# Patient Record
Sex: Female | Born: 1970 | Race: White | Hispanic: No | Marital: Married
Health system: Midwestern US, Community
[De-identification: ages and names within clinical notes are randomized; demographics above are authoritative.]

## PROBLEM LIST (undated history)

## (undated) DIAGNOSIS — Q211 Atrial septal defect, unspecified: Secondary | ICD-10-CM

## (undated) DIAGNOSIS — Z1231 Encounter for screening mammogram for malignant neoplasm of breast: Secondary | ICD-10-CM

## (undated) DIAGNOSIS — Z1159 Encounter for screening for other viral diseases: Secondary | ICD-10-CM

## (undated) DIAGNOSIS — M5416 Radiculopathy, lumbar region: Secondary | ICD-10-CM

---

## 2016-01-07 LAB — LIPID PANEL
Cholesterol, total: 205 MG/DL — ABNORMAL HIGH (ref 0–200)
HDL Cholesterol: 72 MG/DL (ref >60)
HDL Cholesterol: HIGH
LDL, calculated: 115 MG/DL — ABNORMAL HIGH (ref <100)
LDL, calculated: BORDERLINE
LDL, calculated: HIGH
Triglyceride: 92 MG/DL (ref 0–150)

## 2016-01-07 LAB — FAX TO

## 2016-01-07 LAB — GLUCOSE, FASTING: FASTING GLUCOSE: 90 MG/DL (ref 70–100)

## 2016-01-28 NOTE — Progress Notes (Signed)
Visit Type:  Annual gyn exam  Primary Provider:  Dr Erlinda Hong [facility]    CC:  Annual gyn exam.    History of Present Illness:  Patient presents today for her annual gyn exam, states no concerns today .  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 She is having intense night time hot flashes and would like a second trial of rx HRT.     She had a LEEP when she was younger, and two recent paps were normal with positive HPV. Last year a colposcopy showed LSIL. Her paps are being repeated annually with HPV testing.    Her husband had colon cancer and is well at this time after completing treatment. She is having pain with intercourse which has not been effectively treated with prior trials of hrt in the various formulations given.      Patient Health Questionnaire    PHQ-2   1. Over the last 2 weeks how often have you been bothered by any of the following problems?     a. Little interest or pleasure in doing things?Marland KitchenMarland KitchenMarland KitchenNot at all     b. Feeling down, depressed, irritable, or hopeless?Marland KitchenMarland KitchenMarland KitchenNot at all    Total PHQ-2 Score: 0      Problems:    Added new problem of ENCOUNTER FOR GYNECOLOGICAL EXAMINATION (GENERAL) (ROUTINE) WITHOUT ABNORMAL FINDINGS (ICD-V72.31) (ICD10-Z01.419) - Signed  Added new problem of ENCOUNTER FOR SCREENING FOR MALIGNANT NEOPLASM OF CERVIX (ICD-V76.2) (ICD10-Z12.4) - Signed  Removed problem of *ROUTINE GYNECOLOGICAL EXAMINATION (ICD-V72.31) - Signed   Assessed ENCOUNTER FOR SCREENING FOR MALIGNANT NEOPLASM OF CERVIX as comment only - Signed  Assessed ENCOUNTER FOR GYNECOLOGICAL EXAMINATION (GENERAL) (ROUTINE) WITHOUT ABNORMAL FINDINGS as comment only - Signed      Past Medical History:     migraines-no aura     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 grandmother in 77's      Risk Factors:     Smoked Tobacco Use:  Never smoker  Smokeless Tobacco Use:  Never     Counseled to quit/cut down:  no  Passive smoke exposure:  no  Drug use:  no  HIV high-risk behavior:  no  Caffeine  use:  1 drinks per day  Alcohol use:  yes     Type:  wine     Drinks per day:  social  Exercise:  yes     Times per week:  3-5     Type of Exercise:  running,yoga  Seatbelt use:  100 %  Sun Exposure:  occasionally w sunscreen    Family History Risk Factors:     Family History of MI in females < 63 years old:  no     Family History of MI in males < 58 years old:  no      Review of Systems     Gen: no fever, wt change  HEENT: no vis changes, nosebleeding, gumbleeding  CV: occas palpitations, no chest pain  RESP: no wheezing, SOB, cough  GI: no heartburn, nausea, vomiting, loose stool, constipation, blood in stool  GU: no hematuria, dysuria, freq, hesitancy, incontinence  GYN: some pain but no bleeding with IC; sex active without concerns  BREAST: no masses, skin changes, pain, discharge  MUSC/SKEL: no back or joint pain   SKIN: no new lesions  NEURO: no HA, dizziness  PSYCH: no depression,  has anxiety, stress      Vital Signs:    Patient Profile:    45 Years Old Female  Height:     67 inches  Weight:     127.4 pounds  BMI:        19.95     BSA:        1.67  BP Sitting: 100 / 68  (right arm)    Cuff size:  regular    Patient in pain?    No    Problems: Active problems were reviewed with the patient during this visit.  Medications: Medications were reviewed with the patient during this visit.  Allergies: Allergies were reviewed with the patient during this visit.        Vitals Entered By: Derrill Kay CMA (January 28, 2016 3:11 PM)      Physical Exam    General:      well developed, well nourished, in no acute distress  Head:      normocephalic and atraumatic  Mouth:      no deformity or lesions with good dentition  Neck:      no masses, thyromegaly, or abnormal cervical nodes  Chest Wall:      no deformities or breast masses noted  Breasts:      no dominant masses, nipple discharge, skin changes, or axillary adenopathy    Lungs:      clear bilaterally to A & P  Heart:      regular rate and rhythm, S1, S2 without murmurs, rubs, gallops, or clicks  Abdomen:      bowel sounds positive; abdomen soft and non-tender without masses, organomegaly, or hernias noted  Msk:      no deformity or scoliosis noted with normal posture and gait  Extremities:      no clubbing, cyanosis, edema, or deformity noted with normal full range of motion of all joints  Skin:      intact without lesions or rashes  Cervical Nodes:      no significant adenopathy  Axillary Nodes:      no significant adenopathy  Inguinal Nodes:      no significant adenopathy  Psych:      alert and cooperative; normal mood and affect; normal attention span and concentration    Pelvic Exam:  Ext. genitalia:    normal appearance; no lesions or discharge       Urethra:       no discharge       Bladder:       no cystocele        Vagina:        normal appearing without lesions or discharge       Cervix:        normal appearance; no lesions or discharge       Uterus:        normal size and position; no masses       Uterine size:      normal       Adnexa:        no masses or tenderness       Other:     pap done      Blood Pressure:  Today's BP: 100/68 mm Hg            Impression & Recommendations:    Problem # 1:  ENCOUNTER FOR GYNECOLOGICAL EXAMINATION (GENERAL) (ROUTINE) WITHOUT ABNORMAL FINDINGS (ICD-V72.31) (ICD10-Z01.419)  Assessment: Comment Only  normal exam; pap done today. negative colposcopy last year; if this year's pap is HPV pos, would repeat ecc. discontinued progesterone cream as it was ineffective.  Orders:  Preventive - Est ([phone])      Problem # 2:  ENCOUNTER FOR SCREENING FOR MALIGNANT NEOPLASM OF CERVIX (ICD-V76.2) (VQQ59-D63.4)  Assessment: Comment Only  If HPV is still positive, I would recommend repeating ecc.  Orders:  Pap Smear  Screening (PRINT) ([id number])  Pap Smear Collection ([id number])  HPV Screening (PRINT) ([id number])      Problem # 3:  HORMONE REPLACEMENT THERAPY (POSTMENOPAUSAL) (ICD-V07.4) (ICD10-Z79.890)  discussed trial of continuous combined HRT for relief of poor sleep and hot flashes. safety and duration of use discussed.    Medications Added to Medication List This Visit:  1)  Pristiq 25 Mg Xr24h-tab (Desvenlafaxine succinate) .... Take one by mouth once a day.  2)  Estradiol-norethindrone Acet 1-0.5 Mg Tabs (Estradiol-norethindrone acet) .... Take one by mouth once a day.      Prescriptions:  ESTRADIOL-NORETHINDRONE ACET 1-0.5 MG TABS (ESTRADIOL-NORETHINDRONE ACET) Take one by mouth once a day.  #28[Tablet] x 11      Entered and Authorized by:  Yehuda Savannah MD      Signed by:  Yehuda Savannah MD on 01/28/2016      Method used:    Electronically to               [facility] (retail)              [place], [place]  [id number]               Ph: [phone]              Fax: [phone]      RxID:   [id number]            Additional Clinical Support Documentation The medication list contains greater than 5 medications and was reviewed with the patient today. Derrill Kay CMA  January 28, 2016 3:16 PM          I have either personally documented or reviewed the patients past medical, surgical, family and social history and review of systems if present in the note and when recorded by clinical  staff.             Electronically Signed by Yehuda Savannah MD on 01/28/2016 at 3:59 PM  ________________________________________________________________________

## 2016-02-02 LAB — PAP SMEAR (TO SEACOAST)
PAP SMEAR INTERPRETATION, PSM1: NEGATIVE
PAP smear interpretation: NEGATIVE

## 2016-02-02 LAB — HPV (TO SEACOAST)
HPV INTERPRETATION, HP2: HIGH
HPV INTERPRETATION, HP2: UNDETECTABLE
HPV interpretation: HIGH
HPV interpretation: UNDETECTABLE

## 2016-05-03 LAB — RHEUMATOID FACTOR, QT: RHEUMATOID FACTOR: <15 IU/ML (ref <15)

## 2016-05-03 LAB — SED RATE (ESR): ESR: 3 MM/HR (ref 0–20)

## 2016-05-04 LAB — HLA-B27
HLA-B27 BLOOD, THL2: POSITIVE
HLA-B27 BLOOD: POSITIVE
INTERPRETATION, THLI: 200
INTERPRETATION, THLI: DETECTED
INTERPRETATION, THLI: NORMAL
INTERPRETATION: 200
INTERPRETATION: DETECTED
INTERPRETATION: NORMAL

## 2016-12-27 ENCOUNTER — Encounter

## 2016-12-27 ENCOUNTER — Inpatient Hospital Stay: Admit: 2016-12-27 | Payer: PRIVATE HEALTH INSURANCE

## 2016-12-27 DIAGNOSIS — M5416 Radiculopathy, lumbar region: Secondary | ICD-10-CM

## 2017-10-12 ENCOUNTER — Encounter

## 2017-10-12 ENCOUNTER — Inpatient Hospital Stay: Admit: 2017-10-12 | Payer: PRIVATE HEALTH INSURANCE

## 2017-10-12 DIAGNOSIS — Z1231 Encounter for screening mammogram for malignant neoplasm of breast: Secondary | ICD-10-CM

## 2018-06-29 ENCOUNTER — Encounter

## 2018-10-18 ENCOUNTER — Encounter

## 2018-10-18 ENCOUNTER — Inpatient Hospital Stay: Admit: 2018-10-18 | Payer: PRIVATE HEALTH INSURANCE

## 2018-10-18 DIAGNOSIS — Z1231 Encounter for screening mammogram for malignant neoplasm of breast: Secondary | ICD-10-CM

## 2018-11-29 ENCOUNTER — Inpatient Hospital Stay
Admit: 2018-11-29 | Discharge: 2018-11-29 | Disposition: A | Discharge status: Home / Self Care | Payer: PRIVATE HEALTH INSURANCE | Attending: Emergency Medicine

## 2018-11-29 DIAGNOSIS — S39012A Strain of muscle, fascia and tendon of lower back, initial encounter: Secondary | ICD-10-CM

## 2018-11-29 MED ORDER — METHYLPREDNISOLONE 4 MG TABS IN A DOSE PACK
4 mg | ORAL | 0 refills | Status: AC
Start: 2018-11-29 — End: ?

## 2018-11-29 MED ORDER — CARISOPRODOL 350 MG TAB
350 mg | ORAL_TABLET | Freq: Three times a day (TID) | ORAL | 0 refills | Status: AC | PRN
Start: 2018-11-29 — End: ?

## 2018-11-29 NOTE — Other (Signed)
The history is provided by the patient.   Back Pain    This is a new problem. Pertinent negatives include no fever, no numbness, no abdominal pain, no dysuria, no pelvic pain and no weakness.   The patient is a 48 year old female who presents to [facility] for evaluation of central to right low back pain for last 5 days.  She does not recall any inciting factor, although it did start after doing a yoga session.  The pain is worse with movement.  It is a constant pain and rated as 6/10 intensity.  It does not radiate down her legs.  No paresthesias to the legs or perineum.  No bowel or bladder dysfunction.  No fever or weight loss.  No abdominal pain.  She has a history of similar problems in the past.  Most recently, last fall she had a steroid injection that was not helpful.  She has had prior MRI that showed degenerative changes, but no definitive reason for her back pains.  A year and the half ago she had a steroid injection that was beneficial.  She does not recall ever having been on oral steroids for this.  She has tried ibuprofen, heat, gentle stretching without relief.    Past Medical History:   Diagnosis Date   ??? Menopause     39        Past Surgical History:   Procedure Laterality Date   ??? HX ROTATOR CUFF REPAIR Right    ??? HX WISDOM TEETH EXTRACTION           Family History   Problem Relation Age of Onset   ??? Breast Cancer Maternal Grandmother         3670's        Social History     Socioeconomic History   ??? Marital status: MARRIED     Spouse name: Not on file   ??? Number of children: Not on file   ??? Years of education: Not on file   ??? Highest education level: Not on file   Occupational History   ??? Not on file   Social Needs   ??? Financial resource strain: Not on file   ??? Food insecurity     Worry: Not on file     Inability: Not on file   ??? Transportation needs     Medical: Not on file     Non-medical: Not on file   Tobacco Use   ??? Smoking status: Never Smoker   ??? Smokeless tobacco: Never Used    Substance and Sexual Activity   ??? Alcohol use: Yes     Alcohol/week: 3.0 standard drinks     Types: 3 Glasses of wine per week   ??? Drug use: Never   ??? Sexual activity: Not on file   Lifestyle   ??? Physical activity     Days per week: Not on file     Minutes per session: Not on file   ??? Stress: Not on file   Relationships   ??? Social Wellsite geologistconnections     Talks on phone: Not on file     Gets together: Not on file     Attends religious service: Not on file     Active member of club or organization: Not on file     Attends meetings of clubs or organizations: Not on file     Relationship status: Not on file   ??? Intimate partner violence  Fear of current or ex partner: Not on file     Emotionally abused: Not on file     Physically abused: Not on file     Forced sexual activity: Not on file   Other Topics Concern   ??? Not on file   Social History Narrative   ??? Not on file                ALLERGIES: Bactrim [sulfamethoprim] and Pcn [penicillins]    Review of Systems   Constitutional: Negative for fever and unexpected weight change.        No IV drug use.   Gastrointestinal: Negative for abdominal pain.        No bowel incontinence.   Genitourinary: Negative for dysuria, frequency, hematuria and pelvic pain.        No urinary incontinence or retention.   Musculoskeletal: Positive for back pain.   Allergic/Immunologic:        No history of cancer.   Neurological: Negative for weakness and numbness.       Vitals:    11/29/18 1353   BP: 109/73   Pulse: 87   Resp: 16   Temp: 97.9 ??F (36.6 ??C)   SpO2: 98%       Physical Exam  Vitals signs and nursing note reviewed.   Constitutional:       General: She is not in acute distress.     Appearance: She is not ill-appearing or diaphoretic.   HENT:      Head: Atraumatic.   Cardiovascular:      Rate and Rhythm: Normal rate and regular rhythm.      Heart sounds: No murmur.   Pulmonary:      Effort: Pulmonary effort is normal.      Breath sounds: Normal breath sounds.   Abdominal:       General: Abdomen is flat. Bowel sounds are normal.      Palpations: Abdomen is soft. There is no mass.      Tenderness: There is no abdominal tenderness. There is no guarding.   Musculoskeletal:      Comments: Spine is nontender to palpation and percussion, with the exception of the sacral area.  No spasms noted.  No obvious rash.  The tenderness is more on the right than the left.   Skin:     General: Skin is warm and dry.      Coloration: Skin is not pale.   Neurological:      Mental Status: She is alert and oriented to person, place, and time.      Comments: Speech is clear.  Motor function is intact in the lower extremities.  Normal light touch sensation in lower extremities.  Deep tendon reflexes are 2+ and symmetric in the patellar tendons and 1+ and symmetric in the ankle tendons.  No tripod sign but doing passive straight leg raise while sitting does increase her back pain.   Psychiatric:         Mood and Affect: Mood normal.         MDM  Number of Diagnoses or Management Options  Acute myofascial strain of lumbosacral region, initial encounter:   Diagnosis management comments: The patient has signs and symptoms consistent with lumbosacral strain.  No clear indication that she has a herniated disc, diskitis, paraspinous abscess, tumor, dissection, or other serious process causing this pain.  I find no indication for diagnostic studies at this time.  We discussed treatment options, which included  use of a steroid and muscle relaxant.  She would like to try this treatment.  I did review with her potential side effects of the medications.  No obvious significant interactions expected from these medications under current medicines.  She will begin the Medrol Dosepak tomorrow morning.  She can use ibuprofen interim but understands to not use it once she starts Medrol Dosepak.  She can use the carisoprodol as needed for muscle relaxation.  Precautions regarding sedation given.  She was given contact  information for Dr. Francee Gentile to follow up with if not improved in 5 days.  We also reviewed precautions/signs/symptoms regarding when she would need to go the hospital emergency department right away.    I have discussed the working diagnosis, treatment, precautions and follow up with the patient/guardian.  It was explained to them that, in some circumstances, symptoms can change over time, resulting in the need for further evaluation and an ultimate different diagnosis from today's assessment.  The patient/guardian was made aware of signs and symptoms to look for, in the event of worsening condition, that would necessitate going to the hospital emergency department for further evaluation and management.   They were given the opportunity to ask questions.  Those questions were answered to the best of my ability with the information available at this time.  She indicated acknowledgement of this discussion.    Patient Progress  Patient progress: stable             Procedures      Discharge Medication List as of 11/29/2018  2:21 PM      START taking these medications    Details   methylPREDNISolone (Medrol, Pak,) 4 mg tablet Take as directed on package, Normal, Disp-1 Dose Pack, R-0      carisoprodoL (SOMA) 350 mg tablet Take 1 Tab by mouth every eight (8) hours as needed for Muscle Spasm(s). Max Daily Amount: 1,050 mg., Print, Disp-12 Tab, R-0         CONTINUE these medications which have NOT CHANGED    Details   estradioL (ESTRACE) 1 mg tablet Take 1 mg by mouth daily., Historical Med      desvenlafaxine succinate (Pristiq) 25 mg ER tablet Take 25 mg by mouth., Historical Med                 ICD-10-CM ICD-9-CM   1. Acute myofascial strain of lumbosacral region, initial encounter S39.012A 846.0

## 2018-11-29 NOTE — ED Provider Notes (Signed)
The history is provided by the patient.   Back Pain    This is a new problem. Pertinent negatives include no fever, no numbness, no abdominal pain, no dysuria, no pelvic pain and no weakness.   The patient is a 48 year old female who presents to [facility] for evaluation of central to right low back pain for last 5 days.  She does not recall any inciting factor, although it did start after doing a yoga session.  The pain is worse with movement.  It is a constant pain and rated as 6/10 intensity.  It does not radiate down her legs.  No paresthesias to the legs or perineum.  No bowel or bladder dysfunction.  No fever or weight loss.  No abdominal pain.  She has a history of similar problems in the past.  Most recently, last fall she had a steroid injection that was not helpful.  She has had prior MRI that showed degenerative changes, but no definitive reason for her back pains.  A year and the half ago she had a steroid injection that was beneficial.  She does not recall ever having been on oral steroids for this.  She has tried ibuprofen, heat, gentle stretching without relief.    Past Medical History:   Diagnosis Date   ??? Menopause     39        Past Surgical History:   Procedure Laterality Date   ??? HX ROTATOR CUFF REPAIR Right    ??? HX WISDOM TEETH EXTRACTION           Family History   Problem Relation Age of Onset   ??? Breast Cancer Maternal Grandmother         2670's        Social History     Socioeconomic History   ??? Marital status: MARRIED     Spouse name: Not on file   ??? Number of children: Not on file   ??? Years of education: Not on file   ??? Highest education level: Not on file   Occupational History   ??? Not on file   Social Needs   ??? Financial resource strain: Not on file   ??? Food insecurity     Worry: Not on file     Inability: Not on file   ??? Transportation needs     Medical: Not on file     Non-medical: Not on file   Tobacco Use   ??? Smoking status: Never Smoker   ??? Smokeless tobacco: Never Used    Substance and Sexual Activity   ??? Alcohol use: Yes     Alcohol/week: 3.0 standard drinks     Types: 3 Glasses of wine per week   ??? Drug use: Never   ??? Sexual activity: Not on file   Lifestyle   ??? Physical activity     Days per week: Not on file     Minutes per session: Not on file   ??? Stress: Not on file   Relationships   ??? Social Wellsite geologistconnections     Talks on phone: Not on file     Gets together: Not on file     Attends religious service: Not on file     Active member of club or organization: Not on file     Attends meetings of clubs or organizations: Not on file     Relationship status: Not on file   ??? Intimate partner violence  Fear of current or ex partner: Not on file     Emotionally abused: Not on file     Physically abused: Not on file     Forced sexual activity: Not on file   Other Topics Concern   ??? Not on file   Social History Narrative   ??? Not on file                ALLERGIES: Bactrim [sulfamethoprim] and Pcn [penicillins]    Review of Systems   Constitutional: Negative for fever and unexpected weight change.        No IV drug use.   Gastrointestinal: Negative for abdominal pain.        No bowel incontinence.   Genitourinary: Negative for dysuria, frequency, hematuria and pelvic pain.        No urinary incontinence or retention.   Musculoskeletal: Positive for back pain.   Allergic/Immunologic:        No history of cancer.   Neurological: Negative for weakness and numbness.       Vitals:    11/29/18 1353   BP: 109/73   Pulse: 87   Resp: 16   Temp: 97.9 ??F (36.6 ??C)   SpO2: 98%       Physical Exam  Vitals signs and nursing note reviewed.   Constitutional:       General: She is not in acute distress.     Appearance: She is not ill-appearing or diaphoretic.   HENT:      Head: Atraumatic.   Cardiovascular:      Rate and Rhythm: Normal rate and regular rhythm.      Heart sounds: No murmur.   Pulmonary:      Effort: Pulmonary effort is normal.      Breath sounds: Normal breath sounds.   Abdominal:      General:  Abdomen is flat. Bowel sounds are normal.      Palpations: Abdomen is soft. There is no mass.      Tenderness: There is no abdominal tenderness. There is no guarding.   Musculoskeletal:      Comments: Spine is nontender to palpation and percussion, with the exception of the sacral area.  No spasms noted.  No obvious rash.  The tenderness is more on the right than the left.   Skin:     General: Skin is warm and dry.      Coloration: Skin is not pale.   Neurological:      Mental Status: She is alert and oriented to person, place, and time.      Comments: Speech is clear.  Motor function is intact in the lower extremities.  Normal light touch sensation in lower extremities.  Deep tendon reflexes are 2+ and symmetric in the patellar tendons and 1+ and symmetric in the ankle tendons.  No tripod sign but doing passive straight leg raise while sitting does increase her back pain.   Psychiatric:         Mood and Affect: Mood normal.         MDM  Number of Diagnoses or Management Options  Acute myofascial strain of lumbosacral region, initial encounter:   Diagnosis management comments: The patient has signs and symptoms consistent with lumbosacral strain.  No clear indication that she has a herniated disc, diskitis, paraspinous abscess, tumor, dissection, or other serious process causing this pain.  I find no indication for diagnostic studies at this time.  We discussed treatment options, which included  use of a steroid and muscle relaxant.  She would like to try this treatment.  I did review with her potential side effects of the medications.  No obvious significant interactions expected from these medications under current medicines.  She will begin the Medrol Dosepak tomorrow morning.  She can use ibuprofen interim but understands to not use it once she starts Medrol Dosepak.  She can use the carisoprodol as needed for muscle relaxation.  Precautions regarding sedation given.  She was given contact information for Dr.  Francee Gentile to follow up with if not improved in 5 days.  We also reviewed precautions/signs/symptoms regarding when she would need to go the hospital emergency department right away.    I have discussed the working diagnosis, treatment, precautions and follow up with the patient/guardian.  It was explained to them that, in some circumstances, symptoms can change over time, resulting in the need for further evaluation and an ultimate different diagnosis from today's assessment.  The patient/guardian was made aware of signs and symptoms to look for, in the event of worsening condition, that would necessitate going to the hospital emergency department for further evaluation and management.   They were given the opportunity to ask questions.  Those questions were answered to the best of my ability with the information available at this time.  She indicated acknowledgement of this discussion.    Patient Progress  Patient progress: stable             Procedures      Discharge Medication List as of 11/29/2018  2:21 PM      START taking these medications    Details   methylPREDNISolone (Medrol, Pak,) 4 mg tablet Take as directed on package, Normal, Disp-1 Dose Pack, R-0      carisoprodoL (SOMA) 350 mg tablet Take 1 Tab by mouth every eight (8) hours as needed for Muscle Spasm(s). Max Daily Amount: 1,050 mg., Print, Disp-12 Tab, R-0         CONTINUE these medications which have NOT CHANGED    Details   estradioL (ESTRACE) 1 mg tablet Take 1 mg by mouth daily., Historical Med      desvenlafaxine succinate (Pristiq) 25 mg ER tablet Take 25 mg by mouth., Historical Med                 ICD-10-CM ICD-9-CM   1. Acute myofascial strain of lumbosacral region, initial encounter S39.012A 846.0

## 2018-12-06 ENCOUNTER — Inpatient Hospital Stay
Admit: 2018-12-06 | Discharge: 2018-12-06 | Disposition: A | Discharge status: Home / Self Care | Payer: PRIVATE HEALTH INSURANCE | Attending: Medical

## 2018-12-06 DIAGNOSIS — R51 Headache: Secondary | ICD-10-CM

## 2018-12-06 LAB — CBC WITH AUTOMATED DIFF
ABS. BASOPHILS: 0 10*3/uL (ref 0.0–0.1)
ABS. EOSINOPHILS: 0 10*3/uL (ref 0.0–0.5)
ABS. IMM. GRANS.: 0 10*3/uL (ref 0.00–0.03)
ABS. LYMPHOCYTES: 1.2 10*3/uL (ref 1.2–3.7)
ABS. MONOCYTES: 0.5 10*3/uL (ref 0.2–0.8)
ABS. NEUTROPHILS: 7 10*3/uL — ABNORMAL HIGH (ref 1.6–6.1)
BASOPHILS: 0 % (ref 0–1.2)
EOSINOPHILS: 0 %
HCT: 39 % (ref 36.0–45.0)
HGB: 13.4 g/dL (ref 11.8–14.8)
IMMATURE GRANULOCYTES: 1 % — ABNORMAL HIGH (ref 0.0–0.4)
LYMPHOCYTES: 14 %
MCH: 32.2 PG (ref 25.6–32.2)
MCHC: 34.4 g/dL (ref 32.2–35.5)
MCV: 93.8 FL (ref 80.0–95.0)
MONOCYTES: 6 %
MPV: 8.9 FL — ABNORMAL LOW (ref 9.4–12.4)
NEUTROPHILS: 79 %
PLATELET: 239 10*3/uL (ref 150–400)
RBC: 4.16 M/uL (ref 3.93–5.22)
RDW: 12.5 % (ref 11.6–14.4)
WBC: 8.8 10*3/uL (ref 4.0–10.0)

## 2018-12-06 LAB — METABOLIC PANEL, COMPREHENSIVE
A-G Ratio: 1 (ref 1.0–3.0)
ALT (SGPT): 16 U/L (ref 13–56)
AST (SGOT): 13 U/L — ABNORMAL LOW (ref 15–37)
Albumin: 3.8 g/dL (ref 3.4–5.0)
Alk. phosphatase: 40 U/L — ABNORMAL LOW (ref 45–117)
Anion gap: 4 mmol/L — ABNORMAL LOW (ref 5–15)
BUN/Creatinine ratio: 17 (ref 12–20)
BUN: 13 MG/DL (ref 7–18)
Bilirubin, total: 0.33 mg/dL (ref 0.20–1.20)
CO2: 29 mmol/L (ref 21–32)
Calcium: 8.6 MG/DL (ref 8.5–10.1)
Chloride: 105 mmol/L (ref 98–110)
Creatinine: 0.77 MG/DL (ref 0.55–1.02)
GFR est AA: >60 mL/min/{1.73_m2} (ref >60)
GFR est non-AA: >60 mL/min/{1.73_m2} (ref >60)
Glucose: 117 mg/dL — ABNORMAL HIGH (ref 70–100)
Potassium: 3.8 mmol/L (ref 3.5–5.1)
Protein, total: 7.6 g/dL (ref 6.4–8.2)
Sodium: 138 mmol/L (ref 136–145)

## 2018-12-06 LAB — TROPONIN I: Troponin-I, Qt.: <0.015 ng/mL (ref 0.000–0.040)

## 2018-12-06 LAB — EKG, 12 LEAD, INITIAL
Atrial Rate: 88 ms
Heart Rate: 87 {beats}/min
I-40 Front Axis: 17 deg
I-40 Horizontal Axis: 90 deg
P Duration: 117 ms
P Front Axis: 76 deg
P Horizontal Axis: 0 deg
P-R Interval: 143 ms
Q Onset: 506 ms
QRS Axis: 74 deg
QRS Horizontal Axis: 11 deg
QRSD Interval: 82 ms
QT Interval: 381 ms
QTcB: 459 ms
QTcF: 431 ms
RR Interval: 690 ms
S-T Front Axis: 48 deg
S-T Horizontal Axis: 63 deg
T Horizontal Axis: 36 deg
T Wave Axis: 51 deg
T-40 Front Axis: 83 deg
T-40 Horizontal Axis: -3 deg

## 2018-12-06 LAB — UA WITH REFLEX MICRO AND CULTURE
Bilirubin: NEGATIVE
Blood: NEGATIVE
Glucose: NEGATIVE mg/dL
Ketone: NEGATIVE mg/dL
Leukocyte Esterase: NEGATIVE
Nitrites: NEGATIVE
Protein: NEGATIVE mg/dL
Specific gravity: 1.017 (ref 1.001–1.035)
Urobilinogen: 0.2 EU/dL (ref 0.2–1.0)
pH (UA): >=8.5 (ref 5–8)

## 2018-12-06 LAB — EKG 12-LEAD
Atrial Rate: 88 ms
ECG QTC Interval: 381 ms
EKG I-40 FRONT AXIS: 17 deg
EKG I-40 HORIZONTAL AXIS: 90 deg
EKG P DURATION: 117 ms
EKG P FRONT AXIS: 76 deg
EKG P HORIZONTAL AXIS: 0 deg
EKG Q ONSET: 506 ms
EKG QRS AXIS: 74 deg
EKG QRS HORIZONTAL AXIS: 11 deg
EKG QRSD INTERVAL: 82 ms
EKG QTCB: 459 ms
EKG QTCF: 431 ms
EKG RR INTERVAL: 690 ms
EKG S-T FRONT AXIS: 48 deg
EKG S-T HORIZONTAL AXIS: 63 deg
EKG T HORIZONTAL AXIS: 36 deg
EKG T WAVE AXIS: 51 deg
EKG T-40 FRONT AXIS: 83 deg
EKG T-40 HORIZONTAL AXIS: -3 deg
Heart Rate: 87 {beats}/min
P-R Interval: 143 ms

## 2018-12-06 LAB — COMPREHENSIVE METABOLIC PANEL
ALT: 16 U/L (ref 13–56)
AST: 13 U/L — ABNORMAL LOW (ref 15–37)
Albumin/Globulin Ratio: 1 (ref 1.0–3.0)
Albumin: 3.8 g/dL (ref 3.4–5.0)
Alkaline Phosphatase: 40 U/L — ABNORMAL LOW (ref 45–117)
Anion Gap: 4 mmol/L — ABNORMAL LOW (ref 5–15)
BUN: 13 MG/DL (ref 7–18)
Bun/Cre Ratio: 17 NA (ref 12–20)
CO2: 29 mmol/L (ref 21–32)
Calcium: 8.6 MG/DL (ref 8.5–10.1)
Chloride: 105 mmol/L (ref 98–110)
Creatinine: 0.77 MG/DL (ref 0.55–1.02)
EGFR IF NonAfrican American: >60 mL/min/{1.73_m2} (ref >60)
GFR African American: >60 mL/min/{1.73_m2} (ref >60)
Glucose: 117 mg/dL — ABNORMAL HIGH (ref 70–100)
Potassium: 3.8 mmol/L (ref 3.5–5.1)
Sodium: 138 mmol/L (ref 136–145)
Total Bilirubin: 0.33 mg/dL (ref 0.20–1.20)
Total Protein: 7.6 g/dL (ref 6.4–8.2)

## 2018-12-06 LAB — CBC WITH AUTO DIFFERENTIAL
Basophils %: 0 % (ref 0–1.2)
Basophils Absolute: 0 10*3/uL (ref 0.0–0.1)
Eosinophils %: 0 %
Eosinophils Absolute: 0 10*3/uL (ref 0.0–0.5)
Granulocyte Absolute Count: 0 10*3/uL (ref 0.00–0.03)
Hematocrit: 39 % (ref 36.0–45.0)
Hemoglobin: 13.4 g/dL (ref 11.8–14.8)
Immature Granulocytes: 1 % — ABNORMAL HIGH (ref 0.0–0.4)
Lymphocytes %: 14 %
Lymphocytes Absolute: 1.2 10*3/uL (ref 1.2–3.7)
MCH: 32.2 PG (ref 25.6–32.2)
MCHC: 34.4 g/dL (ref 32.2–35.5)
MCV: 93.8 FL (ref 80.0–95.0)
MPV: 8.9 FL — ABNORMAL LOW (ref 9.4–12.4)
Monocytes %: 6 %
Monocytes Absolute: 0.5 10*3/uL (ref 0.2–0.8)
Neutrophils %: 79 %
Neutrophils Absolute: 7 10*3/uL — ABNORMAL HIGH (ref 1.6–6.1)
Platelets: 239 10*3/uL (ref 150–400)
RBC: 4.16 M/uL (ref 3.93–5.22)
RDW: 12.5 % (ref 11.6–14.4)
WBC: 8.8 10*3/uL (ref 4.0–10.0)

## 2018-12-06 LAB — URINALYSIS WITH REFLEX TO CULTURE
Bilirubin, Urine: NEGATIVE
Blood, Urine: NEGATIVE
Glucose, Ur: NEGATIVE mg/dL
Ketones, Urine: NEGATIVE mg/dL
Leukocyte Esterase, Urine: NEGATIVE
Nitrite, Urine: NEGATIVE
Protein, UA: NEGATIVE mg/dL
Specific Gravity, UA: 1.017 NA (ref 1.001–1.035)
Urobilinogen, UA, POCT: 0.2 EU/dL (ref 0.2–1.0)
pH, UA: >=8.5 (ref 5–8)

## 2018-12-06 LAB — TROPONIN: Troponin I: <0.015 ng/mL (ref 0.000–0.040)

## 2018-12-06 MED ORDER — DIPHENHYDRAMINE HCL 50 MG/ML IJ SOLN
50 mg/mL | INTRAMUSCULAR | Status: AC
Start: 2018-12-06 — End: 2018-12-06
  Administered 2018-12-06: 15:00:00 via INTRAVENOUS

## 2018-12-06 MED ORDER — SODIUM CHLORIDE 0.9 % INJECTION
5 mg/mL | INTRAMUSCULAR | Status: AC
Start: 2018-12-06 — End: 2018-12-06
  Administered 2018-12-06: 13:00:00 via INTRAVENOUS

## 2018-12-06 MED ORDER — SODIUM CHLORIDE 0.9% BOLUS IV
0.9 % | INTRAVENOUS | Status: AC
Start: 2018-12-06 — End: 2018-12-06
  Administered 2018-12-06: 13:00:00 via INTRAVENOUS

## 2018-12-06 MED ORDER — KETOROLAC TROMETHAMINE 30 MG/ML INJECTION
30 mg/mL (1 mL) | INTRAMUSCULAR | Status: AC
Start: 2018-12-06 — End: 2018-12-06
  Administered 2018-12-06: 14:00:00 via INTRAVENOUS

## 2018-12-06 MED ORDER — SODIUM CHLORIDE 0.9 % IJ SYRG
Freq: Once | INTRAMUSCULAR | Status: AC
Start: 2018-12-06 — End: 2018-12-06
  Administered 2018-12-06: 13:00:00 via INTRAVENOUS

## 2018-12-06 MED ORDER — ONDANSETRON 4 MG TAB, RAPID DISSOLVE
4 mg | ORAL_TABLET | Freq: Three times a day (TID) | ORAL | 0 refills | Status: AC | PRN
Start: 2018-12-06 — End: ?

## 2018-12-06 MED FILL — NORMAL SALINE FLUSH 0.9 % INJECTION SYRINGE: INTRAMUSCULAR | Qty: 10

## 2018-12-06 MED FILL — PROCHLORPERAZINE EDISYLATE 5 MG/ML INJECTION: 5 mg/mL | INTRAMUSCULAR | Qty: 2

## 2018-12-06 MED FILL — DIPHENHYDRAMINE HCL 50 MG/ML IJ SOLN: 50 mg/mL | INTRAMUSCULAR | Qty: 1

## 2018-12-06 MED FILL — KETOROLAC TROMETHAMINE 30 MG/ML INJECTION: 30 mg/mL (1 mL) | INTRAMUSCULAR | Qty: 1

## 2018-12-06 MED FILL — SODIUM CHLORIDE 0.9 % IV: INTRAVENOUS | Qty: 1000

## 2018-12-06 NOTE — ED Provider Notes (Signed)
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  She denies body aches.  No fevers chills or sweats but does feel fatigued.  Decreased appetite today.  No visual changes no slurred speech no facial droop no numbness no tingling no weakness no chest pain no cough no shortness of breath.  She does not believe that she was exposed anybody with COVID-19.  No known sick contacts at home.  She denies abdominal pain.  No previous abdominal surgeries.  She took 4 mg ODT Zofran without affect in her nausea and vomiting.  She has therefore not been able to take any medication for her headache.  She denies a history of migraines.           Past Medical History:   Diagnosis Date   ??? Menopause     39       Past Surgical History:   Procedure Laterality Date   ??? HX ROTATOR CUFF REPAIR Right    ??? HX WISDOM TEETH EXTRACTION           Family History:   Problem Relation Age of Onset   ??? Breast Cancer Maternal Grandmother         7570's       Social History     Socioeconomic History   ??? Marital status: MARRIED     Spouse name: Not on file   ??? Number of children: Not on file   ??? Years of education: Not on file   ??? Highest education level: Not on file   Occupational History   ??? Not on file   Social Needs   ??? Financial resource strain: Not on file   ??? Food insecurity     Worry: Not on file     Inability: Not on file   ??? Transportation needs     Medical: Not on file     Non-medical: Not on file   Tobacco Use   ??? Smoking status: Never Smoker   ??? Smokeless tobacco: Never Used   Substance and Sexual Activity   ??? Alcohol use: Yes     Alcohol/week: 3.0 standard drinks     Types: 3 Glasses of wine per week   ??? Drug use: Never   ??? Sexual activity: Not on file   Lifestyle   ??? Physical activity     Days per week: Not on file      Minutes per session: Not on file   ??? Stress: Not on file   Relationships   ??? Social Wellsite geologistconnections     Talks on phone: Not on file     Gets together: Not on file     Attends religious service: Not on file     Active member of club or organization: Not on file     Attends meetings of clubs or organizations: Not on file     Relationship status: Not on file   ??? Intimate partner violence     Fear of current or ex partner: Not on file     Emotionally abused: Not on file     Physically abused: Not on file     Forced sexual activity: Not on file   Other Topics Concern   ??? Not on file   Social History Narrative   ??? Not on file  ALLERGIES: Bactrim [sulfamethoprim] and Pcn [penicillins]    Review of Systems   Constitutional: Positive for activity change, appetite change and fatigue. Negative for chills, diaphoresis, fever and unexpected weight change.   HENT: Negative.    Respiratory: Negative.    Cardiovascular: Negative.    Gastrointestinal: Positive for diarrhea, nausea and vomiting. Negative for abdominal distention, abdominal pain, anal bleeding, blood in stool, constipation and rectal pain.   Genitourinary: Negative.    Musculoskeletal: Negative.    Skin: Negative.    Neurological: Positive for headaches. Negative for dizziness, tremors, seizures, syncope, facial asymmetry, speech difficulty, weakness, light-headedness and numbness.   Psychiatric/Behavioral: Negative.        Vitals:    12/06/18 0856   BP: 120/74   Pulse: 92   Resp: 15   Temp: 98.1 ??F (36.7 ??C)   SpO2: 98%   Weight: 63.5 kg (140 lb)   Height: 5\' 7"  (1.702 m)            Physical Exam  Vitals signs and nursing note reviewed.   Constitutional:       General: She is not in acute distress.     Appearance: Normal appearance. She is normal weight. She is ill-appearing. She is not toxic-appearing or diaphoretic.   HENT:      Head: Normocephalic and atraumatic.   Eyes:      Extraocular Movements: Extraocular movements intact.       Conjunctiva/sclera: Conjunctivae normal.      Pupils: Pupils are equal, round, and reactive to light.   Neck:      Musculoskeletal: Normal range of motion and neck supple.   Cardiovascular:      Rate and Rhythm: Normal rate and regular rhythm.      Pulses: Normal pulses.      Heart sounds: Normal heart sounds.   Pulmonary:      Effort: Pulmonary effort is normal.      Breath sounds: Normal breath sounds.   Abdominal:      General: Abdomen is flat. Bowel sounds are normal. There is no distension.      Palpations: Abdomen is soft. There is no mass.      Tenderness: There is no abdominal tenderness. There is no guarding or rebound.      Hernia: No hernia is present.   Musculoskeletal: Normal range of motion.   Skin:     General: Skin is warm and dry.      Capillary Refill: Capillary refill takes less than 2 seconds.   Neurological:      General: No focal deficit present.      Mental Status: She is alert and oriented to person, place, and time. Mental status is at baseline.      Cranial Nerves: No cranial nerve deficit.      Sensory: No sensory deficit.      Motor: No weakness.      Coordination: Coordination normal.      Gait: Gait normal.   Psychiatric:         Mood and Affect: Mood normal.         Behavior: Behavior normal.         Thought Content: Thought content normal.         Judgment: Judgment normal.          MDM  Number of Diagnoses or Management Options  Diagnosis management comments: This is a pleasant 48 year old female patient presenting today for headache, nausea, vomiting, diarrhea that started last night.  On exam she appears ill but nontoxic.  She is speaking clearly and without difficulty.  Vitals are unremarkable.  She has a GCS of 15 stroke score of 0. Neurologic exam is benign.  Cerebellar functions are intact.  Her abdomen is soft nontender without rebound or guarding.  She has good bowel sounds throughout.  Breath sounds are clear  and intact bilaterally in all fields.  We will check COVID-19 swab, basic labs, EKG.  We will give fluids, Toradol, Compazine.  Will re-evaluate.  Do not think that she needs imaging at this time.  She is not anticoagulated a normal neurologic exam.  She has no abnormalities on her abdominal exam.  Will re-evaluate after blood work and medication.  I have a low suspicion for intracranial hemorrhage, appendicitis, cholecystitis, diverticulitis, colitis, ACS or other more significant pathology.  I believe this could be related to COVID-19.         Amount and/or Complexity of Data Reviewed  Clinical lab tests: reviewed and ordered  Tests in the medicine section of CPT??: ordered and reviewed      ED Course as of Dec 06 1054   Thu Dec 06, 2018   1040 EKG today compared to previous from 11/12/2001 this shows a sinus rhythm at 87 beats per minute.  QTC 459. No obvious ST elevations or depressions.  No signs of acute infarct or ischemia.    [KA]   1053 Patient reports that her all of her symptoms have completely resolved however she now feels that she wants a "crawl out of my skin" this is most likely a dystonic reaction to Compazine.  She will receive 25 mg of Benadryl for this.  Anticipate discharge home.  Her COVID-19 swab is pending.  Educated that she should be self quarantine.  Follows CDC recommendations.  We discussed red flags when to return to the emergency department.  She has verbalized her understanding had all questions answered.  I do not feel that she was suffering from any intracranial hemorrhage, mi, ACS, cholecystitis appendicitis colitis diverticulitis bowel perforation obstruction or other more significant pathology.  I think this is most likely infectious in nature.  Potentially related to COVID-19.    [KA]      ED Course User Index  [KA] Grant Ruts, PA       Procedures      NIH Stroke Scale

## 2018-12-06 NOTE — ED Notes (Signed)
Pt is resting comfortably on the stretcher w/ HOB elevated. Pt denies nausea/vomiting or any other discomfort at this time. VS stable. Pt updated on poc. Call light within reach.

## 2018-12-06 NOTE — ED Notes (Signed)
Pt presents for evaluation of HA, N/V/D. States she woke up in the middle of the night with significant HA. States she vomited 3 times, 2 bouts of diarrhea. Denies abd pain. denies black or bloody. Denies fevers. Pt states she took zofran 4 mg approx 2 hours ago. Pt still with headache and nausea. 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

## 2018-12-06 NOTE — ED Provider Notes (Signed)
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  She denies body aches.  No fevers chills or sweats but does feel fatigued.  Decreased appetite today.  No visual changes no slurred speech no facial droop no numbness no tingling no weakness no chest pain no cough no shortness of breath.  She does not believe that she was exposed anybody with COVID-19.  No known sick contacts at home.  She denies abdominal pain.  No previous abdominal surgeries.  She took 4 mg ODT Zofran without affect in her nausea and vomiting.  She has therefore not been able to take any medication for her headache.  She denies a history of migraines.           Past Medical History:   Diagnosis Date   ??? Menopause     39       Past Surgical History:   Procedure Laterality Date   ??? HX ROTATOR CUFF REPAIR Right    ??? HX WISDOM TEETH EXTRACTION           Family History:   Problem Relation Age of Onset   ??? Breast Cancer Maternal Grandmother         56's       Social History     Socioeconomic History   ??? Marital status: MARRIED     Spouse name: Not on file   ??? Number of children: Not on file   ??? Years of education: Not on file   ??? Highest education level: Not on file   Occupational History   ??? Not on file   Social Needs   ??? Financial resource strain: Not on file   ??? Food insecurity     Worry: Not on file     Inability: Not on file   ??? Transportation needs     Medical: Not on file     Non-medical: Not on file   Tobacco Use   ??? Smoking status: Never Smoker   ??? Smokeless tobacco: Never Used   Substance and Sexual Activity   ??? Alcohol use: Yes     Alcohol/week: 3.0 standard drinks     Types: 3 Glasses of wine per week   ??? Drug use: Never   ??? Sexual activity: Not on file   Lifestyle   ??? Physical activity     Days per week: Not on file     Minutes per session: Not on file    ??? Stress: Not on file   Relationships   ??? Social Wellsite geologist on phone: Not on file     Gets together: Not on file     Attends religious service: Not on file     Active member of club or organization: Not on file     Attends meetings of clubs or organizations: Not on file     Relationship status: Not on file   ??? Intimate partner violence     Fear of current or ex partner: Not on file     Emotionally abused: Not on file     Physically abused: Not on file     Forced sexual activity: Not on file   Other Topics Concern   ??? Not on file   Social History Narrative   ??? Not on file  ALLERGIES: Bactrim [sulfamethoprim] and Pcn [penicillins]    Review of Systems   Constitutional: Positive for activity change, appetite change and fatigue. Negative for chills, diaphoresis, fever and unexpected weight change.   HENT: Negative.    Respiratory: Negative.    Cardiovascular: Negative.    Gastrointestinal: Positive for diarrhea, nausea and vomiting. Negative for abdominal distention, abdominal pain, anal bleeding, blood in stool, constipation and rectal pain.   Genitourinary: Negative.    Musculoskeletal: Negative.    Skin: Negative.    Neurological: Positive for headaches. Negative for dizziness, tremors, seizures, syncope, facial asymmetry, speech difficulty, weakness, light-headedness and numbness.   Psychiatric/Behavioral: Negative.        Vitals:    12/06/18 0856   BP: 120/74   Pulse: 92   Resp: 15   Temp: 98.1 ??F (36.7 ??C)   SpO2: 98%   Weight: 63.5 kg (140 lb)   Height: 5\' 7"  (1.702 m)            Physical Exam  Vitals signs and nursing note reviewed.   Constitutional:       General: She is not in acute distress.     Appearance: Normal appearance. She is normal weight. She is ill-appearing. She is not toxic-appearing or diaphoretic.   HENT:      Head: Normocephalic and atraumatic.   Eyes:      Extraocular Movements: Extraocular movements intact.      Conjunctiva/sclera: Conjunctivae normal.      Pupils: Pupils  are equal, round, and reactive to light.   Neck:      Musculoskeletal: Normal range of motion and neck supple.   Cardiovascular:      Rate and Rhythm: Normal rate and regular rhythm.      Pulses: Normal pulses.      Heart sounds: Normal heart sounds.   Pulmonary:      Effort: Pulmonary effort is normal.      Breath sounds: Normal breath sounds.   Abdominal:      General: Abdomen is flat. Bowel sounds are normal. There is no distension.      Palpations: Abdomen is soft. There is no mass.      Tenderness: There is no abdominal tenderness. There is no guarding or rebound.      Hernia: No hernia is present.   Musculoskeletal: Normal range of motion.   Skin:     General: Skin is warm and dry.      Capillary Refill: Capillary refill takes less than 2 seconds.   Neurological:      General: No focal deficit present.      Mental Status: She is alert and oriented to person, place, and time. Mental status is at baseline.      Cranial Nerves: No cranial nerve deficit.      Sensory: No sensory deficit.      Motor: No weakness.      Coordination: Coordination normal.      Gait: Gait normal.   Psychiatric:         Mood and Affect: Mood normal.         Behavior: Behavior normal.         Thought Content: Thought content normal.         Judgment: Judgment normal.          MDM  Number of Diagnoses or Management Options  Diagnosis management comments: This is a pleasant 48 year old female patient presenting today for headache, nausea, vomiting, diarrhea that started last night.  On exam she appears ill but nontoxic.  She is speaking clearly and without difficulty.  Vitals are unremarkable.  She has a GCS of 15 stroke score of 0. Neurologic exam is benign.  Cerebellar functions are intact.  Her abdomen is soft nontender without rebound or guarding.  She has good bowel sounds throughout.  Breath sounds are clear and intact bilaterally in all fields.  We will check COVID-19 swab, basic labs, EKG.  We will give fluids, Toradol, Compazine.   Will re-evaluate.  Do not think that she needs imaging at this time.  She is not anticoagulated a normal neurologic exam.  She has no abnormalities on her abdominal exam.  Will re-evaluate after blood work and medication.  I have a low suspicion for intracranial hemorrhage, appendicitis, cholecystitis, diverticulitis, colitis, ACS or other more significant pathology.  I believe this could be related to COVID-19.         Amount and/or Complexity of Data Reviewed  Clinical lab tests: reviewed and ordered  Tests in the medicine section of CPT??: ordered and reviewed      ED Course as of Dec 06 1054   Thu Dec 06, 2018   1040 EKG today compared to previous from 11/12/2001 this shows a sinus rhythm at 87 beats per minute.  QTC 459. No obvious ST elevations or depressions.  No signs of acute infarct or ischemia.    [KA]   1053 Patient reports that her all of her symptoms have completely resolved however she now feels that she wants a "crawl out of my skin" this is most likely a dystonic reaction to Compazine.  She will receive 25 mg of Benadryl for this.  Anticipate discharge home.  Her COVID-19 swab is pending.  Educated that she should be self quarantine.  Follows CDC recommendations.  We discussed red flags when to return to the emergency department.  She has verbalized her understanding had all questions answered.  I do not feel that she was suffering from any intracranial hemorrhage, mi, ACS, cholecystitis appendicitis colitis diverticulitis bowel perforation obstruction or other more significant pathology.  I think this is most likely infectious in nature.  Potentially related to COVID-19.    [KA]      ED Course User Index  [KA] Grant Ruts, PA       Procedures      NIH Stroke Scale

## 2018-12-06 NOTE — ED Triage Notes (Addendum)
Pt presents for evaluation of HA, N/V/D. States she woke up in the middle of the night with significant HA. States she vomited 3 times, 2 bouts of diarrhea. Denies abd pain. denies black or bloody. Denies fevers. Pt states she took zofran 4 mg approx 2 hours ago. Pt still with headache and nausea. 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

## 2018-12-06 NOTE — ED Notes (Signed)
Assumed pt care. [place], PA-C at bedside

## 2018-12-06 NOTE — ED Notes (Signed)
Assumed pt care. [place], PA-C at bedside

## 2018-12-06 NOTE — ED Notes (Addendum)
Pt is resting comfortably on the stretcher w/ HOB elevated. Pt denies nausea/vomiting or any other discomfort at this time. VS stable. Pt updated on poc. Call light within reach.

## 2018-12-09 LAB — SARS-COV-2: SARS-CoV-2 RNA: NOT DETECTED

## 2018-12-09 LAB — COVID-19: Sars-Cov-2 RNA: NOT DETECTED

## 2018-12-10 NOTE — Telephone Encounter (Signed)
Your coronavirus test aka covid-19 test was negative.  It is possible your symptoms were related to another illness.  It is important to know that although the test is fairly accurate, there is a possibility of a false negative result.  If you are still having symptoms, you need to continue self-quarantine for at least 7 days and until 3 days after your last day of symptoms (without the use of any medication).  You may also call 211 which is the [facility] Covid hotline for additional information.  A member of our care management team may be reaching out to monitor how you are feeling.      Pt. Notified.   Signed By: Marygrace Drought, LPN     Dec 10, 2018

## 2019-05-06 ENCOUNTER — Encounter

## 2019-05-06 ENCOUNTER — Inpatient Hospital Stay: Admit: 2019-05-06 | Payer: PRIVATE HEALTH INSURANCE

## 2019-05-06 DIAGNOSIS — Z1159 Encounter for screening for other viral diseases: Secondary | ICD-10-CM

## 2019-05-07 LAB — CRSP SARS-COV-2, PCR
CRSP SARS-CoV-2, PCR: NEGATIVE
Crsp Sars-Cov-2, PCR: NEGATIVE

## 2019-09-26 ENCOUNTER — Encounter

## 2019-09-26 ENCOUNTER — Inpatient Hospital Stay: Admit: 2019-09-26 | Payer: PRIVATE HEALTH INSURANCE | Attending: Family Medicine

## 2019-09-26 DIAGNOSIS — Z1231 Encounter for screening mammogram for malignant neoplasm of breast: Secondary | ICD-10-CM

## 2020-09-25 ENCOUNTER — Inpatient Hospital Stay: Admit: 2020-09-25 | Payer: PRIVATE HEALTH INSURANCE | Attending: Family Medicine

## 2020-09-25 ENCOUNTER — Encounter

## 2020-09-25 DIAGNOSIS — Z1231 Encounter for screening mammogram for malignant neoplasm of breast: Secondary | ICD-10-CM

## 2021-08-09 ENCOUNTER — Encounter

## 2021-09-10 ENCOUNTER — Inpatient Hospital Stay: Admit: 2021-09-10 | Payer: BLUE CROSS/BLUE SHIELD | Primary: Family Medicine

## 2021-09-10 DIAGNOSIS — Z1231 Encounter for screening mammogram for malignant neoplasm of breast: Secondary | ICD-10-CM

## 2023-10-03 IMAGING — MR MRI LUMBAR SPINE W/WO CONTRAST
6 of 12 series · 12 of 48 positions shown · IV contrast (Gadolinium)
Comparison: MRI pelvis October 03, 2023, MRI left hip November 26, 2022.

________________________________________________________________________________________________ 
MRI LUMBAR SPINE W/WO CONTRAST, 10/03/2023 [DATE]: 
CLINICAL INDICATION: Disorder of bone, unspecified , chronic low back pain. 
Concern for bone marrow process.
TECHNIQUE: Multiplanar, multiecho position MR images of the lumbar spine were 
performed without and with 6.5 mL of Gadavist were injected intravenously by 
hand. 1 mL of Gadavist discarded. Patient was scanned on a 1.5T magnet

[Series 201: survey · axial · 10.0mm · 1.25mm/px · z∈[-148,+106]mm · 2 of 10 slices shown]
[im 1/10]
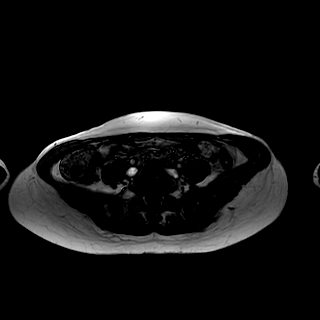
[im 10/10]
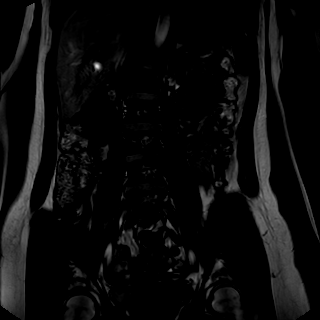

[Series 301: t2w_cor-surv · coronal · 6.0mm · 0.62mm/px · 2 of 14 slices shown]
[im 1/14]
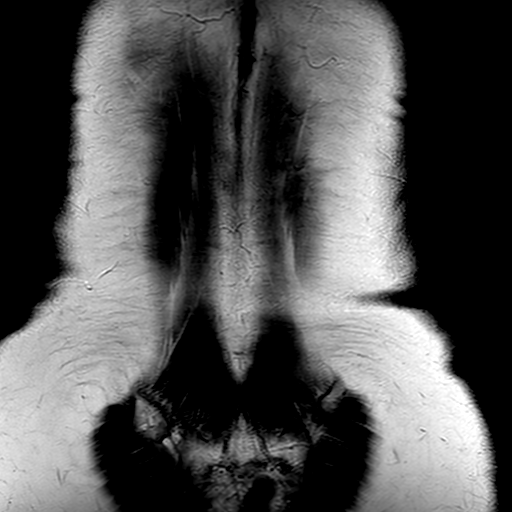
[im 14/14]
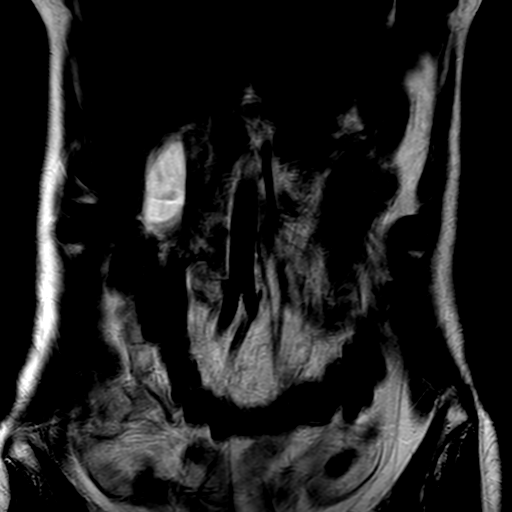

[Series 401: t1_tse_sag · sagittal · 4.0mm · 0.41mm/px · 2 of 19 slices shown]
[im 1/19]
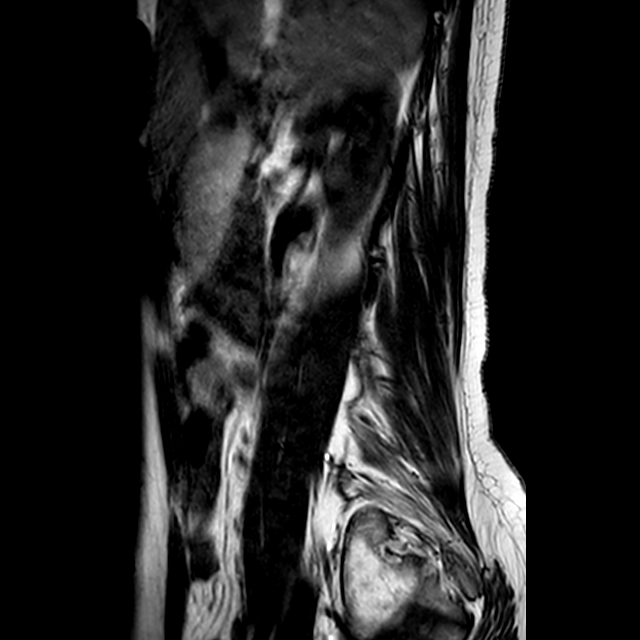
[im 19/19]
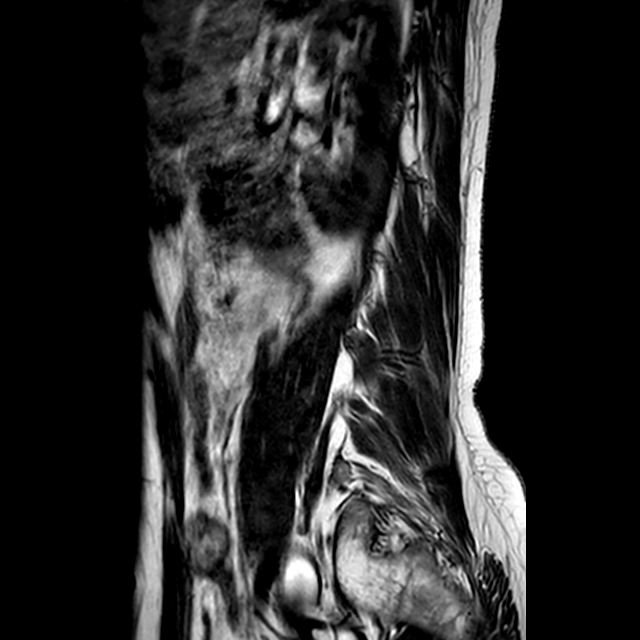

[Series 502: (id)_mdixon_tse · sagittal · 4.0mm · 0.47mm/px · 2 of 19 slices shown]
[im 1/19]
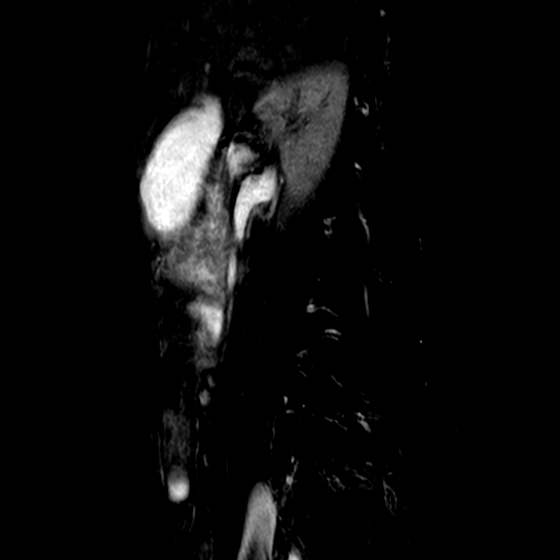
[im 19/19]
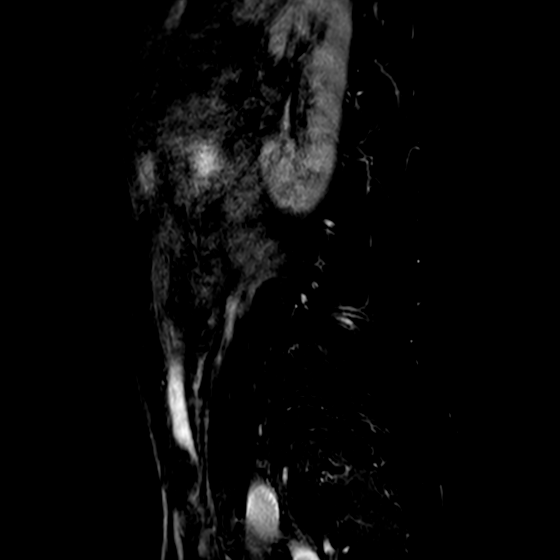

[Series 503: st2w_mdixon_tse · sagittal · 4.0mm · 0.47mm/px · 2 of 19 slices shown]
[im 1/19]
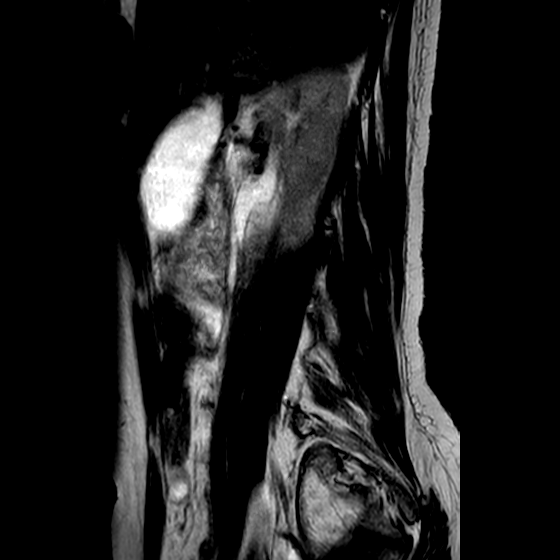
[im 19/19]
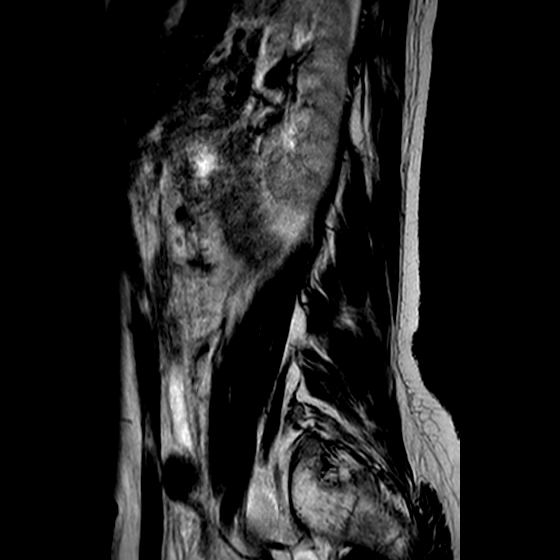

[Series 602: (id) view_ax mpr · axial · 1.0mm · 0.25mm/px · z∈[-171,-134]mm · 2 of 116 slices shown]
[im 1/116]
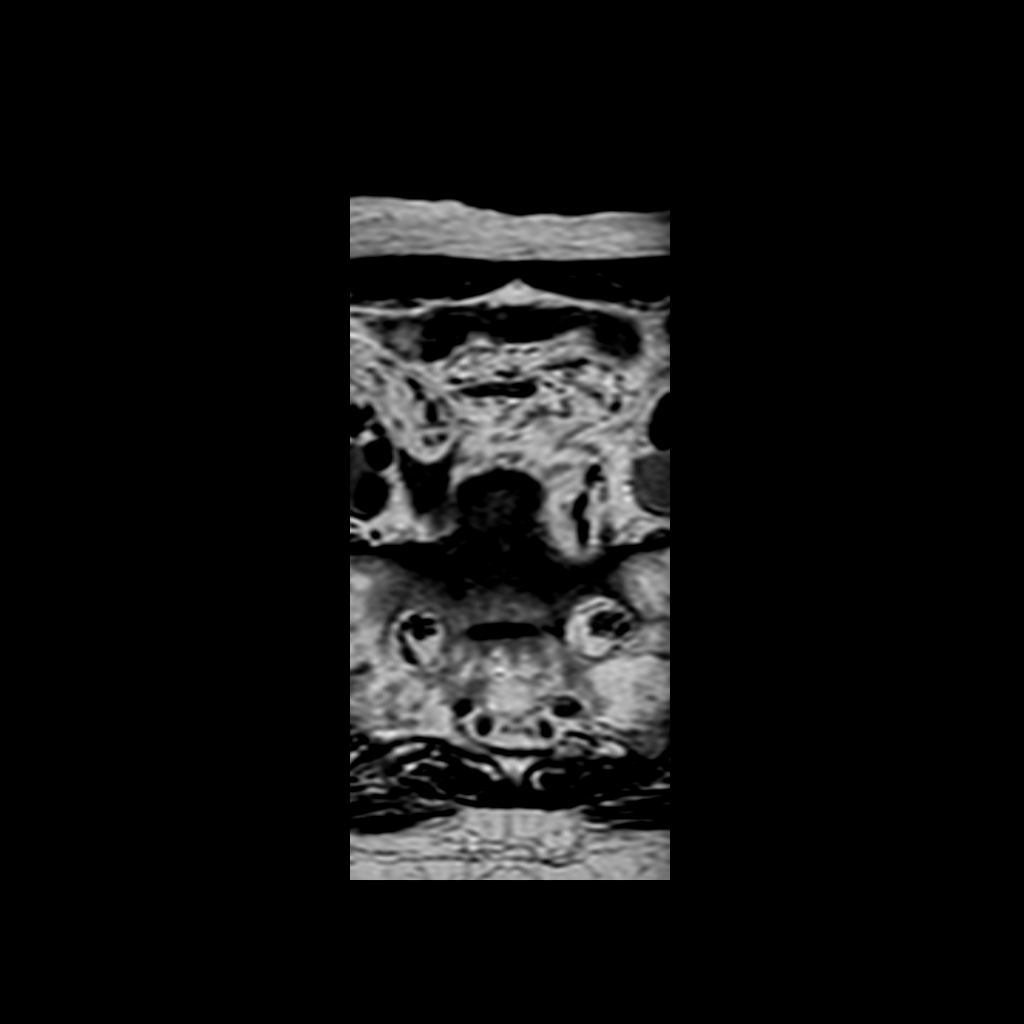
[im 20/116]
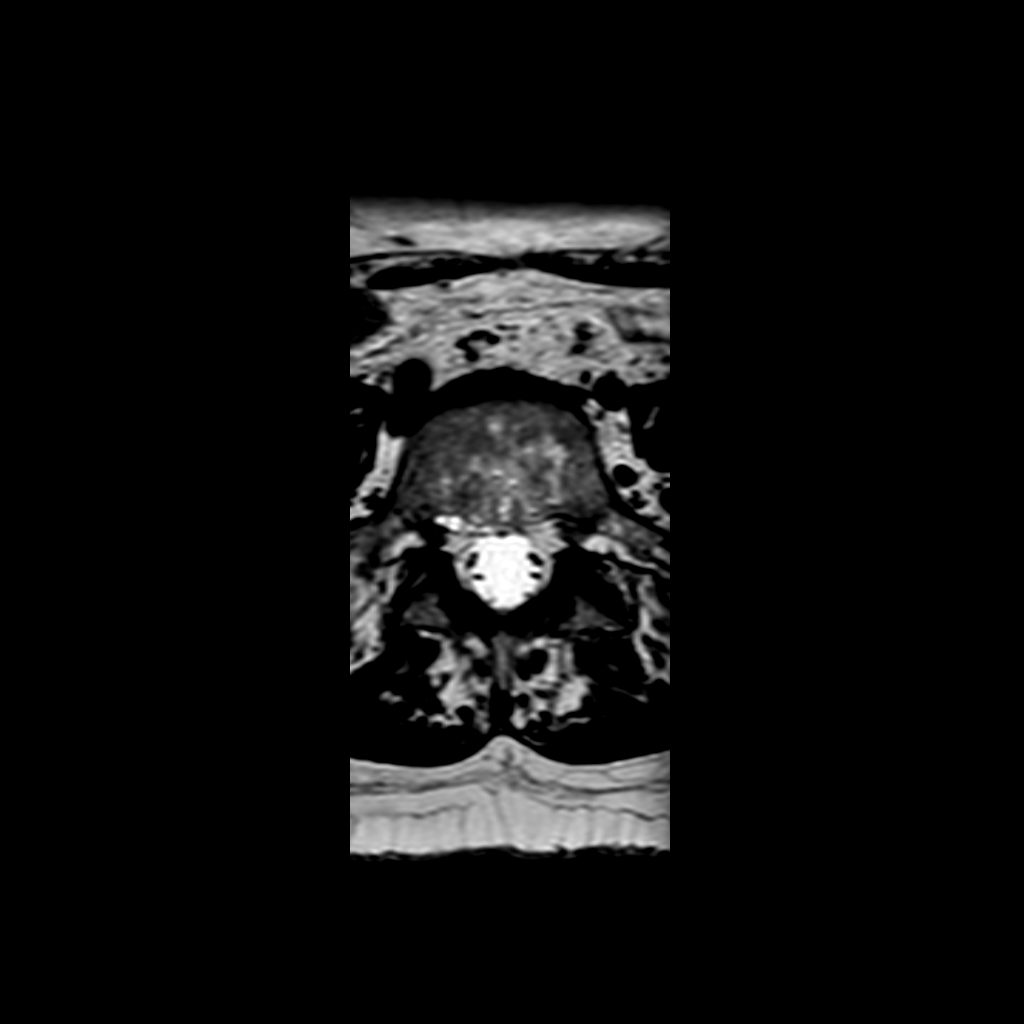

[12 of 48 positions shown; findings below may reference images not displayed]

FINDINGS: -------------------------------------------------------------------------------- 
------ 
GENERAL: 
Nomenclature is based on 5 lumbar type vertebral bodies.     
ALIGNMENT: Mild dextroconvex thoracic lumbar scoliosis. Normal sagittal 
alignment. 
VERTEBRAL BODY HEIGHT: Normal.  
MARROW SIGNAL: No focal suspect signal abnormality. 
CORD SIGNAL: Normal distal spinal cord and cauda equina. Conus medullaris 
terminates at L1. 
ADDITIONAL FINDINGS: There is some apparent artifact along the posterior lateral 
right kidney. This could be reflective of a surgical clip in this location.. 
Gallstone. 
Modic I-II: None. 
Ligamentum Flavum > 2.5 mm: All levels. 
-------------------------------------------------------------------------------- 
------ 
SEGMENTAL: 
T12-L1: Normal disc height and signal. No herniation. Normal facets. No spinal 
canal or neural foraminal stenosis. 
L1-L2: Normal disc height and signal. No herniation. Normal facets. No spinal 
canal or neural foraminal stenosis. 
L2-L3: Normal disc height and signal. No herniation. Normal facets. No spinal 
canal or neural foraminal stenosis. 
L3-L4: Normal disc height and signal. Canal patent. Normal facets. Borderline 
right and mild left foraminal narrowing. 
L4-L5: Central annular fissure. Loss of disc signal. Mild diffuse annular bulge. 
Canal patent. Mild bilateral foraminal narrowing. Facet arthropathy. 
L5-S1: Loss of disc signal. Canal and foramina are patent. Mild facet 
arthropathy. 
-------------------------------------------------------------------------------- 
------
IMPRESSION: Marrow signal pattern is unremarkable. 
L4-L5, annular fissure may be a pain generator. Other mild lumbar degenerative 
changes.

## 2023-10-03 IMAGING — MR MRI PELVIS W/WO CONTRAST
4 of 9 series · 9 of 48 positions shown · IV contrast (Gadolinium)
Comparison: MRI examination the left hip of 11/26/2022

________________________________________________________________________________________________ 
MRI PELVIS W/WO CONTRAST, 10/03/2023 [DATE]: 
CLINICAL INDICATION: Chronic low back pain. Concern for bone marrow process. 
Abnormal [HOSPITAL] [facility].
TECHNIQUE: Multiplanar, multiecho position MR images of the pelvis were 
performed without and with 6.5 mL of  Gadavist were injected intravenously by 
hand. 1 mL of Gadavist discarded. Patient was scanned on a 1.5T magnet.

[Series 901: survey · axial · 10.0mm · 1.25mm/px · z∈[-289,-53]mm · 2 of 11 slices shown]
[im 1/11]
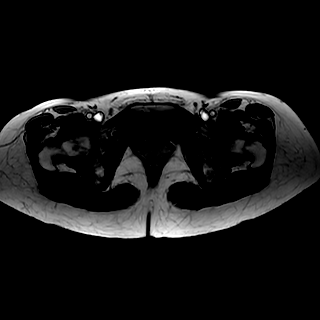
[im 11/11]
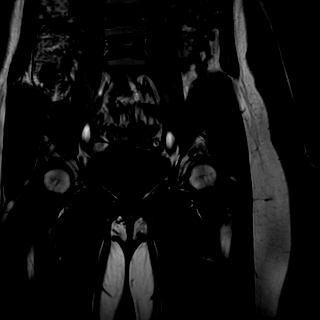

[Series 1001: t1_(person_name) · axial · 6.0mm · 0.41mm/px · z∈[-381,-150]mm · 3 of 36 slices shown]
[im 1/36]
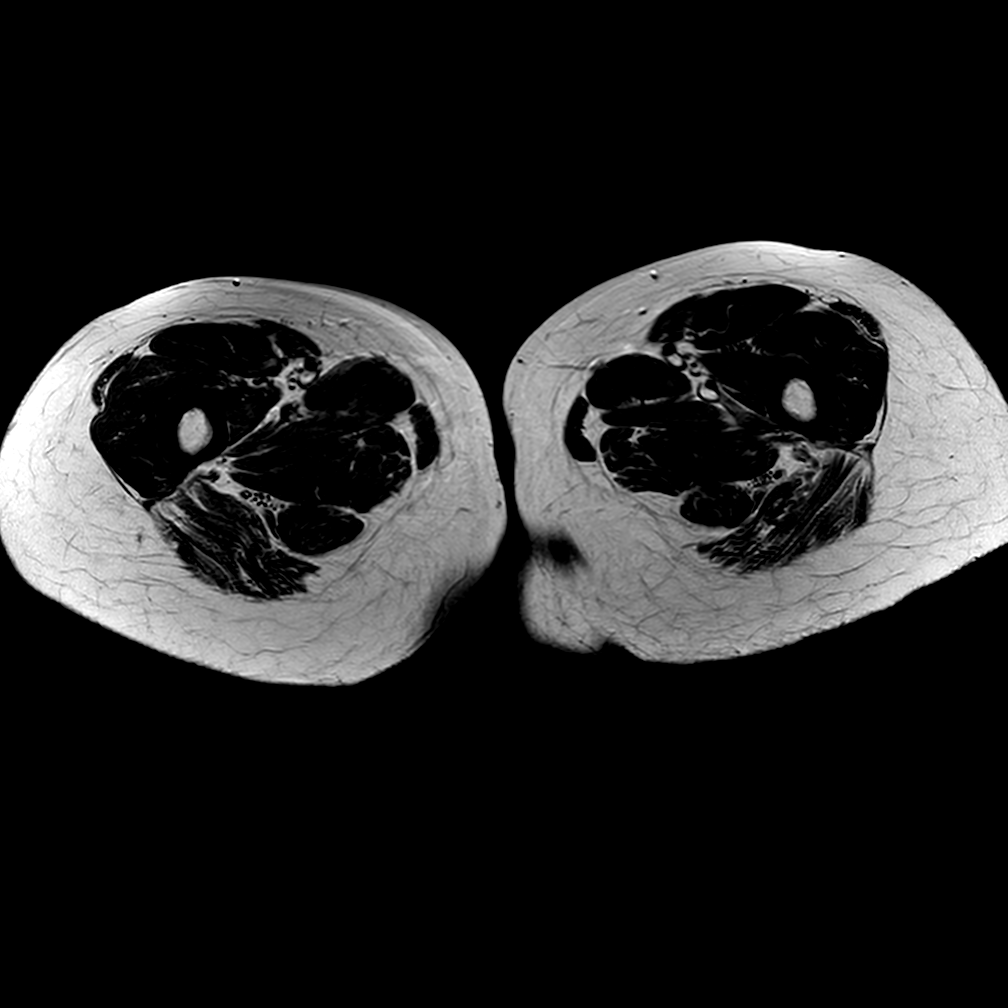
[im 18/36]
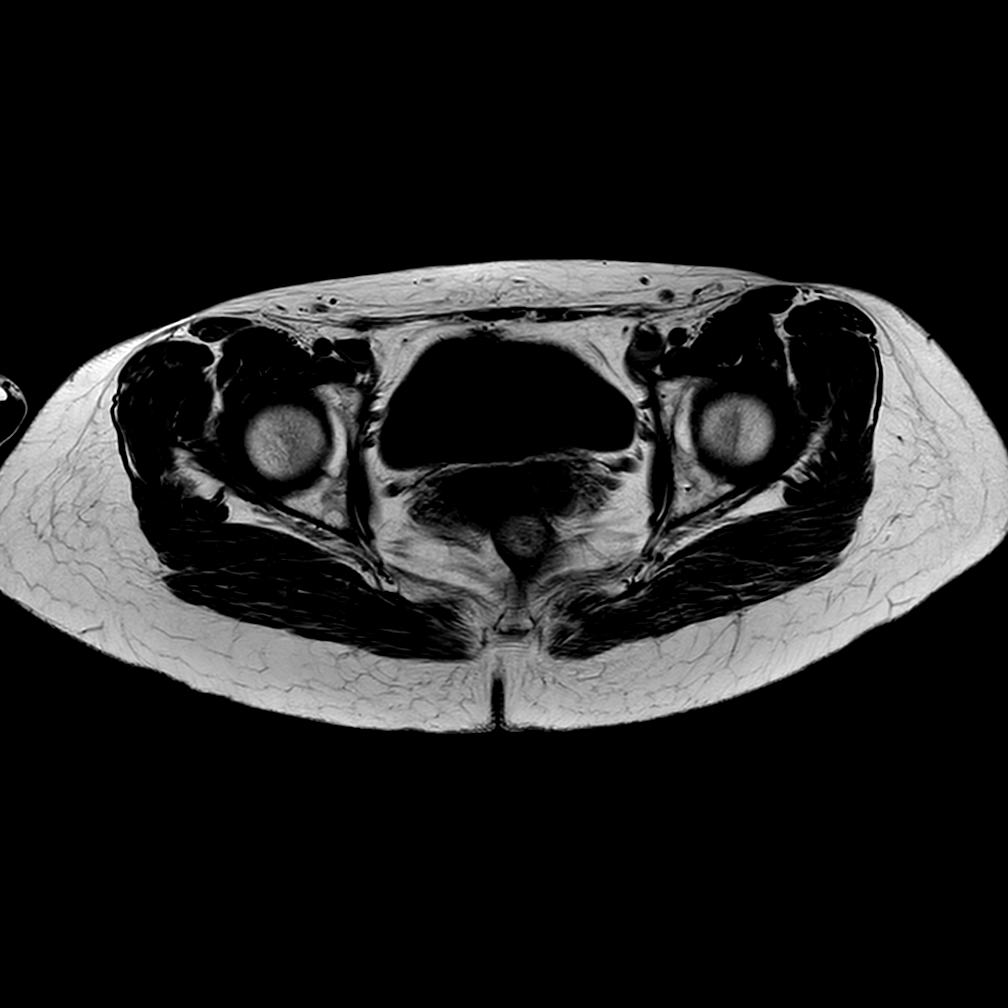
[im 36/36]
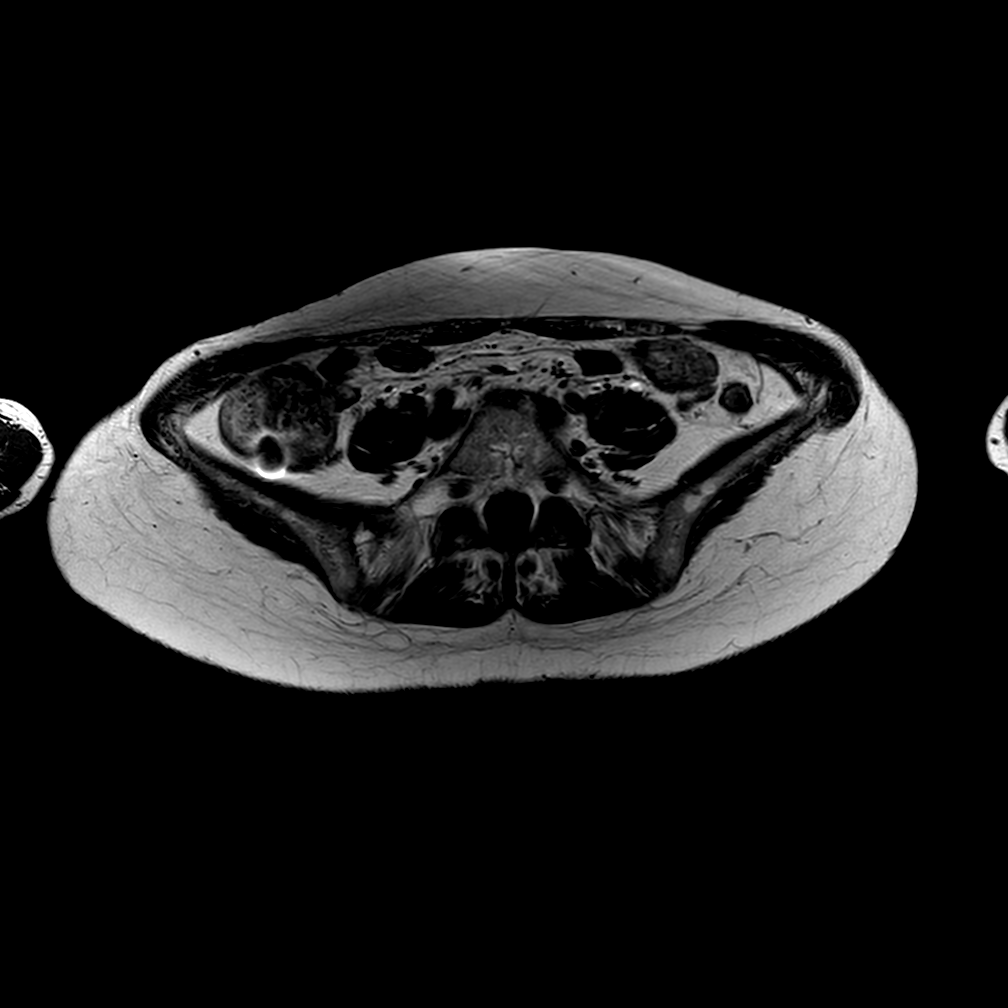

[Series 1101: (person_name)_(person_name)_(person_name) · axial · 6.0mm · 0.64mm/px · z∈[-381,-150]mm · 3 of 36 slices shown]
[im 1/36]
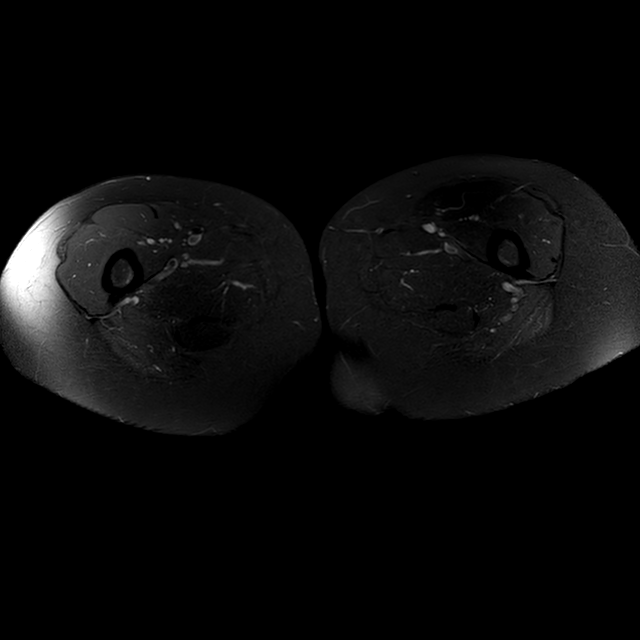
[im 18/36]
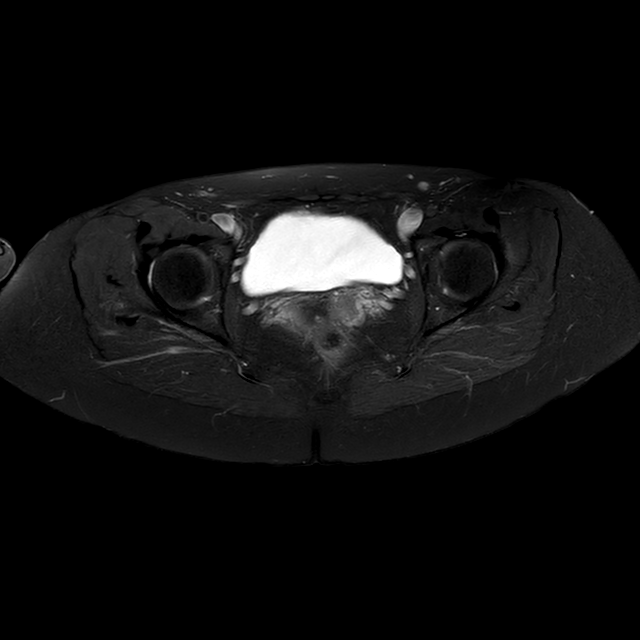
[im 36/36]
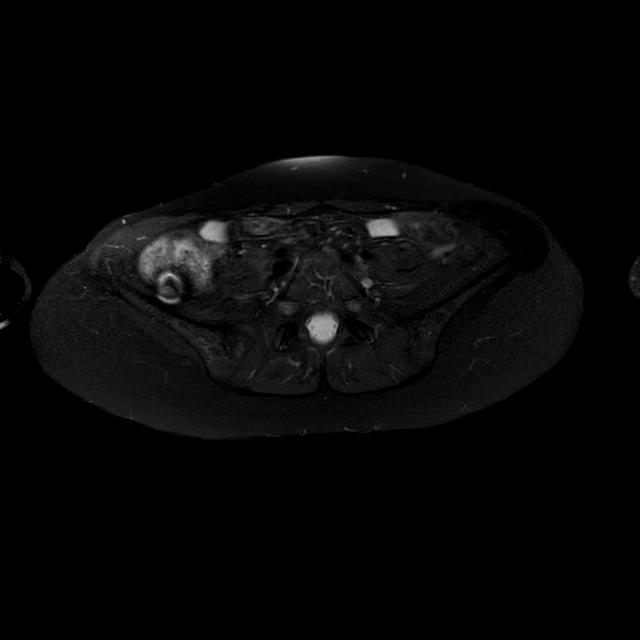

[Series 1201: t1_cor · coronal · 5.0mm · 0.60mm/px · 1 of 30 slices shown]
[im 1/30]
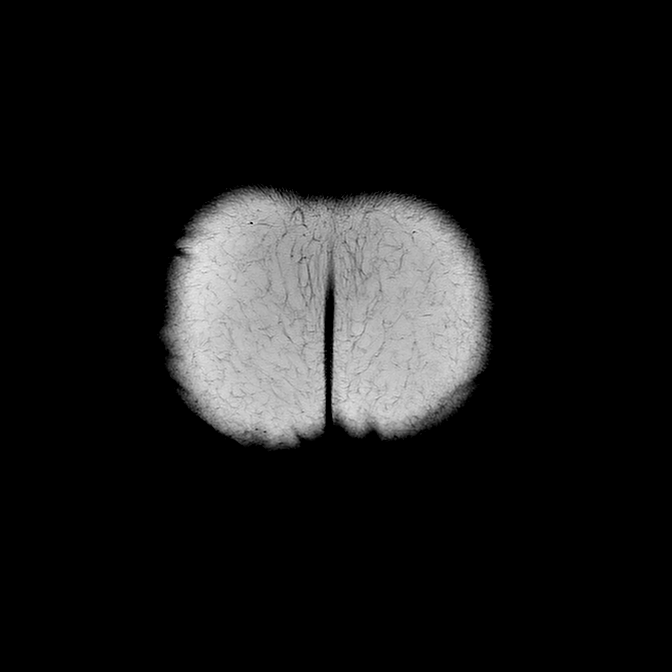

[9 of 48 positions shown; findings below may reference images not displayed]

FINDINGS: HIPS:  Mild articular cartilaginous loss both hips with mild labral fraying. 
There is a 7 x 3 mm para meniscal cyst associated with the anterior left hip. No 
hip joint effusion. Both femoral heads maintain a spherical configuration 
without evidence of avascular necrosis or subarticular collapse. No abnormal 
morphology of the proximal femurs or acetabulum to predispose to impingement  
BONES: Normal marrow signal intensity of the proximal hips, pelvis, sacrum and 
included lower lumbar spine. No fracture, contusion or marrow replacing lesion. 
No infiltrative marrow process. No abnormal marrow enhancement. Included lumbar 
spine demonstrates multilevel degenerative change. SI joints show mild 
degenerative change. 
SOFT TISSUES: The bilateral abductor cuffs are preserved. The insertions of the 
iliopsoas tendons are intact. The origins of the hamstrings are preserved. 
Rectus abdominis-adductor complex is preserved. The musculature is symmetric 
without strain, atrophy or mass. No focal fluid collection or distended bursa. 
Specifically, no iliopsoas or trochanteric bursitis. Included neurovascular 
bundles are negative. Intrapelvic contents include a fluid-filled tubular 
structure within the left adnexal region measuring approximately 5.3 cm AP by 2 
cm TR. This was present on the prior pelvic MR exam and may represent a 
distended fallopian tube.
IMPRESSION: 1.  No abnormal marrow signal intensity or enhancement. 
2.  Mild degenerative changes. 
3.  Tubular structure left adnexal region is unchanged and may represent  
hydrosalpinx. Consideration could be made for follow-up pelvic ultrasound exam.
